# Patient Record
Sex: Female | Born: 1970 | Race: White | Hispanic: No | Marital: Married | Smoking: Never smoker
Health system: Southern US, Community
[De-identification: ages and names within clinical notes are randomized; demographics above are authoritative.]

## PROBLEM LIST (undated history)

## (undated) DIAGNOSIS — R002 Palpitations: Secondary | ICD-10-CM

## (undated) HISTORY — DX: Palpitations: R00.2

---

## 1999-05-02 ENCOUNTER — Other Ambulatory Visit: Admission: RE | Admit: 1999-05-02 | Discharge: 1999-05-02 | Payer: Self-pay | Admitting: Obstetrics and Gynecology

## 1999-09-14 ENCOUNTER — Inpatient Hospital Stay (HOSPITAL_COMMUNITY): Admission: AD | Admit: 1999-09-14 | Discharge: 1999-09-14 | Payer: Self-pay | Admitting: Obstetrics and Gynecology

## 1999-10-17 ENCOUNTER — Inpatient Hospital Stay (HOSPITAL_COMMUNITY): Admission: AD | Admit: 1999-10-17 | Discharge: 1999-10-17 | Payer: Self-pay | Admitting: Obstetrics and Gynecology

## 1999-11-02 ENCOUNTER — Inpatient Hospital Stay (HOSPITAL_COMMUNITY): Admission: AD | Admit: 1999-11-02 | Discharge: 1999-11-04 | Payer: Self-pay | Admitting: Obstetrics & Gynecology

## 1999-12-20 ENCOUNTER — Other Ambulatory Visit: Admission: RE | Admit: 1999-12-20 | Discharge: 1999-12-20 | Payer: Self-pay | Admitting: Obstetrics and Gynecology

## 2000-03-12 ENCOUNTER — Encounter: Admission: RE | Admit: 2000-03-12 | Discharge: 2000-03-12 | Payer: Self-pay | Admitting: Obstetrics and Gynecology

## 2000-03-12 ENCOUNTER — Encounter: Payer: Self-pay | Admitting: Obstetrics and Gynecology

## 2001-01-01 ENCOUNTER — Other Ambulatory Visit: Admission: RE | Admit: 2001-01-01 | Discharge: 2001-01-01 | Payer: Self-pay | Admitting: Obstetrics and Gynecology

## 2001-09-27 ENCOUNTER — Encounter (INDEPENDENT_AMBULATORY_CARE_PROVIDER_SITE_OTHER): Payer: Self-pay

## 2001-09-27 ENCOUNTER — Ambulatory Visit (HOSPITAL_COMMUNITY): Admission: RE | Admit: 2001-09-27 | Discharge: 2001-09-27 | Payer: Self-pay | Admitting: Obstetrics and Gynecology

## 2002-09-01 ENCOUNTER — Inpatient Hospital Stay (HOSPITAL_COMMUNITY): Admission: AD | Admit: 2002-09-01 | Discharge: 2002-09-01 | Payer: Self-pay | Admitting: Obstetrics and Gynecology

## 2002-09-23 ENCOUNTER — Inpatient Hospital Stay (HOSPITAL_COMMUNITY): Admission: AD | Admit: 2002-09-23 | Discharge: 2002-11-02 | Payer: Self-pay | Admitting: Obstetrics and Gynecology

## 2002-09-23 ENCOUNTER — Ambulatory Visit (HOSPITAL_COMMUNITY): Admission: RE | Admit: 2002-09-23 | Discharge: 2002-09-23 | Payer: Self-pay | Admitting: Obstetrics and Gynecology

## 2002-09-23 ENCOUNTER — Encounter: Payer: Self-pay | Admitting: Obstetrics and Gynecology

## 2002-09-24 ENCOUNTER — Encounter: Payer: Self-pay | Admitting: Obstetrics and Gynecology

## 2002-09-25 ENCOUNTER — Encounter: Payer: Self-pay | Admitting: *Deleted

## 2002-09-26 ENCOUNTER — Encounter: Payer: Self-pay | Admitting: *Deleted

## 2002-09-28 ENCOUNTER — Encounter: Payer: Self-pay | Admitting: *Deleted

## 2002-09-29 ENCOUNTER — Encounter: Payer: Self-pay | Admitting: Obstetrics and Gynecology

## 2002-10-02 ENCOUNTER — Encounter: Payer: Self-pay | Admitting: Obstetrics and Gynecology

## 2002-10-05 ENCOUNTER — Encounter: Payer: Self-pay | Admitting: Obstetrics and Gynecology

## 2002-10-07 ENCOUNTER — Encounter: Payer: Self-pay | Admitting: Obstetrics and Gynecology

## 2002-10-09 ENCOUNTER — Encounter: Payer: Self-pay | Admitting: Obstetrics and Gynecology

## 2002-10-12 ENCOUNTER — Encounter: Payer: Self-pay | Admitting: Obstetrics and Gynecology

## 2002-10-14 ENCOUNTER — Encounter: Payer: Self-pay | Admitting: Obstetrics and Gynecology

## 2002-10-16 ENCOUNTER — Encounter: Payer: Self-pay | Admitting: Obstetrics and Gynecology

## 2002-10-19 ENCOUNTER — Encounter: Payer: Self-pay | Admitting: Obstetrics and Gynecology

## 2002-10-21 ENCOUNTER — Encounter: Payer: Self-pay | Admitting: Obstetrics and Gynecology

## 2002-10-23 ENCOUNTER — Encounter: Payer: Self-pay | Admitting: Obstetrics and Gynecology

## 2002-10-26 ENCOUNTER — Encounter: Payer: Self-pay | Admitting: Obstetrics and Gynecology

## 2002-10-27 ENCOUNTER — Encounter: Payer: Self-pay | Admitting: Obstetrics and Gynecology

## 2002-10-28 ENCOUNTER — Encounter (INDEPENDENT_AMBULATORY_CARE_PROVIDER_SITE_OTHER): Payer: Self-pay | Admitting: Specialist

## 2002-11-03 ENCOUNTER — Encounter: Admission: RE | Admit: 2002-11-03 | Discharge: 2002-12-03 | Payer: Self-pay | Admitting: Obstetrics and Gynecology

## 2002-12-09 ENCOUNTER — Other Ambulatory Visit: Admission: RE | Admit: 2002-12-09 | Discharge: 2002-12-09 | Payer: Self-pay | Admitting: Obstetrics and Gynecology

## 2003-01-02 ENCOUNTER — Encounter: Admission: RE | Admit: 2003-01-02 | Discharge: 2003-02-01 | Payer: Self-pay | Admitting: Obstetrics and Gynecology

## 2003-12-30 ENCOUNTER — Other Ambulatory Visit: Admission: RE | Admit: 2003-12-30 | Discharge: 2003-12-30 | Payer: Self-pay | Admitting: Obstetrics and Gynecology

## 2005-02-13 ENCOUNTER — Other Ambulatory Visit: Admission: RE | Admit: 2005-02-13 | Discharge: 2005-02-13 | Payer: Self-pay | Admitting: Obstetrics and Gynecology

## 2010-02-14 ENCOUNTER — Ambulatory Visit: Payer: Self-pay | Admitting: Family Medicine

## 2010-02-14 DIAGNOSIS — M7750 Other enthesopathy of unspecified foot: Secondary | ICD-10-CM | POA: Insufficient documentation

## 2010-02-14 DIAGNOSIS — M79609 Pain in unspecified limb: Secondary | ICD-10-CM | POA: Insufficient documentation

## 2010-02-14 DIAGNOSIS — Q667 Congenital pes cavus, unspecified foot: Secondary | ICD-10-CM | POA: Insufficient documentation

## 2010-10-10 NOTE — Assessment & Plan Note (Signed)
Summary: 4pm Suzanne Roberts - POSSIBLE ST FX FOOT/RUNNER   Vital Signs:  Patient profile:   40 year old female Height:      65 inches Weight:      120 pounds BMI:     20.04 BP sitting:   127 / 82  Vitals Entered By: Lillia Pauls CMA (February 14, 2010 4:02 PM)  History of Present Illness: 40 yo F here with right foot pain  Patient is an active female - runs 22 mpw, spins, and works out with [employer]. States yesterday developed pain in right forefoot in the afternoon. Earlier that day she did plyo, spinning, and worked out with [employer]. Pain did not occur during workout but only several hours later when sitting at work Pain on top of right foot. No swelling or bruising.   Has h/o stress fracture in femur about 15 years ago. Takes some supplemental calcium and vitamin D. Also reports pain in dorsum and plantar left foot about 2 miles into her runs that radiates up her foot. Has some otc inserts that provide good arch support.  Allergies (verified): No Known Drug Allergies  Physical Exam  General:  Well-developed,well-nourished,in no acute distress; alert,appropriate and cooperative throughout examination Msk:  Bilateral feet: No gross deformity, swelling, or bruising. Transverse arch breakdown with significant callus 2nd-4th MT heads. Cavus feet. No TTP bilateral feet currently. FROM toes and ankles. No hallux valgus or rigidus. NV intact distally Forefoot runner with otherwise neutral gait. Additional Exam:  MSK u/s:  No evidence of bony irregularities, edema, increased doppler flow bilateral feet dorsally that would be c/w stress fracture.   Impression & Recommendations:  Problem # 1:  FOOT PAIN, BILATERAL (ICD-729.5) Assessment New 2/2 metatarsalgia. No evidence of stress fracture.  She may also have developed some tendinitis in dorsum right foot - stated earlier had pain with dorsiflexion of foot.  Provided MT pads in bilateral orthotics which felt comfortable to her.  See  instructions for further.  Problem # 2:  METATARSALGIA (ICD-726.70) Assessment: New MT pads.  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  Patient Instructions: 1)  Take 1300mg  of calcium and 800 International Units of vitamin D every day. 2)  You do not have a stress fracture. 3)  Continue activity as tolerated - for the next 2-3 running sessions, start by dropping down to 50% of your normal runs at 60% speed and increase each session. 4)  Wear the metatarsal pads in your shoes. 5)  Follow up with [place] as needed.

## 2011-01-26 NOTE — Discharge Summary (Signed)
NAME:  Suzanne Roberts, Suzanne Roberts NO.:  [id number]   MEDICAL RECORD NO.:  [id number]                   PATIENT TYPE:  INP   LOCATION:  9138                                 FACILITY:  [facility]   PHYSICIAN:  Tracie Harrier, M.D.              DATE OF BIRTH:  Nov 25, 1970   DATE OF ADMISSION:  09/23/2002  DATE OF DISCHARGE:  11/02/2002                                 DISCHARGE SUMMARY   ADMISSION DIAGNOSES:  1. Intrauterine pregnancy at 29-1/7 weeks estimated gestational age.  2. Twin gestation with intrauterine growth retardation of twin B.   DISCHARGE DIAGNOSES:  1. Status post low transverse cesarean section.  2. Viable female infant, viable female infant.   PROCEDURE:  Primary low transverse cesarean section.   REASON FOR ADMISSION:  Please see dictated H&P.   HOSPITAL COURSE:  The patient is a 40 year old white, married female,  gravida 3, para 1 that was admitted to [facility] at 27-1/7  weeks estimated gestational age with a twin gestation.  Twin B was noted to  have intrauterine growth retardation that had been followed by serial  ultrasounds.  The patient was admitted due to concerning Doppler flow  studies.  The patient was admitted for bed rest and IV fluids and  observation of twin B.  Doppler studies continued to reveal elevated FD  ratios but normal MCA scores.  Twin B was gaining weight but remained in  less than the fifth percentile where twin A grew at the 95th percentile.  At  34-1/7 weeks amniocentesis was performed on twin A, the larger twin.  Lecithin/sphingomyelin ratio was 2.2:1 without prostaglandin.  After  consulting with maternal fetal medicine and the neonatal intensive care unit  the decision was made to proceed with a cesarean delivery.  The patient was  taken to the operating room where spinal anesthesia was administered without  difficulty.  A low transverse incision was made with delivery of twin A, a  viable female  infant weighing 5 pounds, 10 ounces with Apgar's of 7 at one  minute, 8 at five minutes.  Arterial cord pH was 7.34.  Twin B was also  delivered, a viable female infant weighing 2 pounds, one ounce with Apgar's  of 7 at one minute, 9 and five minutes.  Arterial cord pH was 7.34.  The  patient tolerated the procedure well and was taken to the recovery room in  stable condition.  On postoperative day one patient complained of some  incisional soreness, babies were stable in the NICU, patient had good return  of bowel function, abdomen was soft, fundus was firm.  Abdominal dressing  was removed revealing an incision that was clean, dry and intact.  Ecchymosis was noted inferior and superior to the incisional site.  Labs  revealed hemoglobin of 10.2, platelet count of 119,000, WBC of 7800.  On  postoperative day two patient complained of some  bright red bleeding on the  left margin of her incision, abdomen was soft and fundus was firm.  A small  amount of oozing was noted at the left side of the incisional site,  questionable staple had been pulled, the incision itself appeared intact  with slight ecchymosis noted superior and inferior to the incisional site.  The patient was ambulating well, tolerating a regular diet without complaint  of nausea and vomiting.  On postoperative day three the patient had  complained of some chills in the middle of the night, she was without fever,  abdomen was soft, some mild induration was noted two cm above the incisional  site, fundus was firm and nontender.  On postoperative day four the patient  was afebrile, fundus was firm and nontender, hematoma was still evident,  otherwise she was doing well and ambulating without assistance.  On  postoperative day five incision continued to have ecchymosis superior and  inferior to the incision which also extended into the inguinal folds  bilaterally.  The patient was ambulating well, she was having good relief  with  p.o. pain medications and was ready for discharge.  Staples did remain  in place.   CONDITION ON DISCHARGE:  Good.   DIET:  Regular as tolerated.   ACTIVITY:  No heavy lifting, no driving x2 weeks, no vaginal entry.   FOLLOW UP:  The patient is to follow up in the office in one week for an  incision check.  She is to call for temperature greater than 100 degrees,  persistent nausea and vomiting, heavy vaginal bleeding, and/or redness or  drainage from the incisional site.   DISCHARGE MEDICATIONS:  Percocet 5/325 #30 one q.4-6h p.r.n., Motrin 600 mg  q.6h p.r.n., prenatal vitamins one p.o. daily, Colace one p.o. daily p.r.n.      Julio Sicks, N.P.                        Tracie Harrier, M.D.    CC/MEDQ  D:  12/03/2002  T:  12/04/2002  Job:  [id number]

## 2011-01-26 NOTE — Op Note (Signed)
[facility]  Patient:    Suzanne Roberts, Suzanne Roberts Visit Number: [id number] MRN: [id number]          Service Type: DSU Location: [facility] Attending Physician:  Marcelle Overlie Dictated by:   Marcelle Overlie, M.D. Proc. Date: 09/27/01 Admit Date:  09/27/2001 Discharge Date: 09/27/2001                             Operative Report  PREOPERATIVE DIAGNOSIS:       Missed abortion.  POSTOPERATIVE DIAGNOSIS:      Missed abortion.  PROCEDURE:                    Dilation and evacuation.  SURGEON:                      Marcelle Overlie, M.D.  ANESTHESIA:                   MAC with paracervical block.  COMPLICATIONS:                None.  PATHOLOGY:                    Uterine curettings.  DESCRIPTION OF PROCEDURE:     The patient was taken to the operating room, where she received IV sedation and was placed in the lithotomy position.  The vagina and vulva were prepped and draped in the usual sterile fashion.  An in-and-out catheter was used to empty the bladder.  A speculum was inserted in the vagina.  The cervix was grasped with a tenaculum and the uterus was sounded to 9 cm.  Paracervical block was performed and the cervical internal os was gently dilated using Pratt dilators.  A #7 suction cannula was inserted into the uterus.  The uterus was then suctioned thoroughly x 3 with contents consistent with products of conception.  A sharp curet was inserted into the uterus.  The uterus was thoroughly curetted on all four walls.  There was very little tissue.  A final suction curettage was performed, with scant tissue retrieved.  There was no bleeding at the end of the procedure.  All sponge, lap and instrument counts were correct x 2.  The patient went to the recovery room in good condition. Dictated by:   Marcelle Overlie, M.D. Attending Physician:  Marcelle Overlie DD:  09/27/01 TD:  09/29/01 Job: [id number]

## 2011-01-26 NOTE — Op Note (Signed)
NAME:  Suzanne Roberts, Suzanne Roberts                        ACCOUNT NO.:  [id number]   MEDICAL RECORD NO.:  [id number]                   PATIENT TYPE:  INP   LOCATION:  9156                                 FACILITY:  [facility]   PHYSICIAN:  Dineen Kid. Rana Snare, M.D.                 DATE OF BIRTH:  October 21, 1970   DATE OF PROCEDURE:  10/28/2002  DATE OF DISCHARGE:                                 OPERATIVE REPORT   PREOPERATIVE DIAGNOSIS:  Intrauterine pregnancy at 10 and 2/7 weeks with  twin gestations with IUGR of twin B, vertex breech presentation and mature  amniocentesis of twin A.   POSTOPERATIVE DIAGNOSIS:  Intrauterine pregnancy at 57 and 2/7 weeks with  twin gestations with IUGR of twin B, vertex breech presentation and mature  amniocentesis of twin A.   OPERATION PERFORMED:  Primary low transverse cesarean section.   SURGEON:  Dineen Kid. Rana Snare, M.D.   ANESTHESIA:  Spinal.   INDICATIONS FOR PROCEDURE:  The patient is a 40 year old G3, P1, A1 who was  admitted originally to the hospital at 29-1/7 weeks with a known twin  gestation and IUGR of twin B followed by serial ultrasound.  She had  originally planned amniocentesis for genetic evaluation.  She was admitted  because of no end diastolic flow on Doppler cord flow studies on twin B,  again at 29-1/7 weeks.  She remained on bed rest, IV fluids and  hospitalization until today due to abnormal Doppler flow studies of twin B,  showed minimal growth of twin B.  After the initial study there remained  some end diastolic flow but elevated S:D ratios on twin B, the weight  remaining less than fifth percentile where as twin A grew at 95th  percentile.  She had normal fetal surveillance throughout her hospital  course.  Finally at 34-1/7 weeks underwent amniocentesis of the larger twin.  The procedure was uncomplicated and the L:S ratio was 2.2:1 with no PG.  Due  to mature amniocentesis for the larger twin, we elected to proceed with  primary cesarean  section and delivery of the twins after extensive maternal  fetal medicine consultation and also NICU consultation.  The patient and her  husband understood the risks of prematurity and at this time elected to  proceed with delivery so as to optimize care for twin B.  The risks and  benefits again were discussed at length and informed consent was obtained.   OPERATIVE FINDINGS:  Viable female infant which was twin A.  Apgars were 7 and  8, pH arterial was 7.34, the weight was 5 pounds, 10 ounces.  Twin B was a  viable female, Apgars were 7 and 9 with a weight of 2 pounds, 10 ounces and  a pH arterial of 7.34.   DESCRIPTION OF PROCEDURE:  After adequate anesthesia, the patient was placed  in the supine position with left lateral tilt.  She  was sterilely prepped  and draped with Betadine solution and a Foley catheter was sterilely placed.  A Pfannenstiel skin incision was made two fingerbreadths above the pubic  symphysis, was taken sharply through fascia, incised transversely and  extended superior and inferior off the belly of the rectus muscle, separated  sharply in the midline and the peritoneum was entered sharply.  The bladder  flap was created and placed behind the bladder blade.  A low submyotomy  incision was made down to the amniotic sac of B.  Amniotomy was performed.  Clear fluid was retrieved and the infant's vertex was delivered and the  nuchal cord times one was reduced, the infant was then delivered, cord  clamped, cut and the infant was handed to the pediatricians for  resuscitation.  Cord blood was then obtained and umbilical clamp was then  placed on cord A.  Amniotomy was performed on the sac of twin B.  She was  delivered in a breech position.  Arms were easily reduced and the head was  atraumatically delivered.  The nares and pharynx were suctioned.  Cord was  clamped, cut and she was handed to the pediatricians for resuscitation.  Cord blood was then obtained, the  placentas were extracted manually, noted  to be fused with _________ insertion of twin B's cord and hyperspiraling of  the vessels, sent to pathology for further evaluation.  The uterus was  exteriorized, wiped clean with a dry lap.  The myotomy incision was closed  in two layers, the first layer a running locking layer, the second in an  imbricating layer with good approximation and good hemostasis achieved.  Normal uterus, tubes and ovaries were noted.  The uterus was placed back  into the peritoneal cavity, after a copious amount of irrigation, adequate  hemostasis was assured.  The peritoneum was then closed with 2-0 Vicryl in a  running fashion.  The rectus muscle plicated in the midline.  After  irrigation was applied and adequate hemostasis was assured, the fascia was  then closed with a #1 Vicryl in a running fashion.  Irrigation was applied  and after adequate hemostasis, the skin was stapled and Steri-Strips  applied.  The patient received 1 gm of Cefoxitin after delivering the  placenta.  Sponge and instrument counts were normal times three.  The  estimated blood loss was 800 cc.  The babies were in stable condition and  did get to the NICU for further observation.                                                Dineen Kid Rana Snare, M.D.    DCL/MEDQ  D:  10/28/2002  T:  10/28/2002  Job:  [id number]

## 2011-01-26 NOTE — H&P (Signed)
NAME:  Suzanne Roberts, Suzanne Roberts NO.:  [id number]   MEDICAL RECORD NO.:  [id number]                   PATIENT TYPE:  OUT   LOCATION:  ULT                                  [facility]ILITY:  [facility]   PHYSICIAN:  Dineen Kid. Rana Snare, M.D.                 DATE OF BIRTH:  06/22/1971   DATE OF ADMISSION:  09/23/2002  DATE OF DISCHARGE:                                HISTORY & PHYSICAL   HISTORY OF PRESENT ILLNESS:  The patient is a 40 year old G3, P1, A1 at 45  and 1/7 weeks estimated gestational age based on last menstrual period and  also on early ultrasound with twin pregnancies with known IUGR of twin B.  She has been followed by serial ultrasounds.  Also [facility] at [facility]  recently for ultrasound by Dr. Verne Grain, M.D.  Twin B is female.  She  has consistently lagged one and a half to two weeks behind the dates  symmetrically and Dr. Delton See felt this was due to more constitutional  reasons and a small placenta.  Twin A is a boy and he has consistently  measured consistent with dates or larger.  She presented today for routine  ultrasound at [facility] for weight and Doppler flow.  The weights  were appropriate with twin A measuring 1611 g which is 90th percentile for  29 weeks.  He has a grade 2 placenta, is in the cephalic position.  Twin B  which is the IUGR baby is in the breech position, is the female.  She is  measuring 901 g which is 10th percentile for 29 weeks with a grade 2  placenta and normal amniotic fluid on both babies.  The Doppler cord flow,  however, on twin A is normal.  Twin B shows a normal MCA of 1.97, but no end-  diastolic flow on the umbilical artery.  Because of no end-diastolic flow,  the patient will be admitted for aggressive monitoring, betamethasone,  rescan for diastolic flow and if this does not improve, consider delivery.  Her pregnancy has been complicated by preterm contractions.  She has not had  cervical dilation.  Her estimated  date of confinement is December 10, 2002.  She  had a normal Glucola.  Group B Strep was negative as well as fetal  fibronectin on September 01, 2002.   PHYSICAL EXAMINATION:  VITAL SIGNS:  Blood pressure 100/60.  HEART:  Regular rate and rhythm.  LUNGS:  Clear to auscultation bilaterally.  ABDOMEN:  Gravid, nontender.  PELVIC:  Cervix is closed, thick, and high.   IMPRESSION AND PLAN:  Intrauterine pregnancy at 35 and 1/7 weeks with  vertex/breech fetuses.  Twin B with symmetric intrauterine growth  restriction.  Now the growth is approximately 10the percentile which is an  improvement for this baby.  However, Doppler flow studies are abnormal with  no end-diastolic flow on twin B.  At this time we plan to aggressively  hydrate, place the patient on bed rest, and monitor both babies thoroughly.  We will repeat the Doppler flow in 24-48 hours and give her betamethasone in  the meantime.  If the babies heart rates and monitoring is nonreassuring we  plan to deliver by cesarean section.  Otherwise, if there is no improvement  in end-diastolic flow, we will plan to deliver the twins.                                               Dineen Kid Rana Snare, M.D.    DCL/MEDQ  D:  09/23/2002  T:  09/23/2002  Job:  [id number]

## 2011-06-27 ENCOUNTER — Other Ambulatory Visit: Payer: Self-pay | Admitting: Obstetrics and Gynecology

## 2011-06-27 DIAGNOSIS — R928 Other abnormal and inconclusive findings on diagnostic imaging of breast: Secondary | ICD-10-CM

## 2011-07-24 ENCOUNTER — Ambulatory Visit
Admission: RE | Admit: 2011-07-24 | Discharge: 2011-07-24 | Disposition: A | Discharge status: Home / Self Care | Payer: BC Managed Care – PPO | Source: Ambulatory Visit | Attending: Obstetrics and Gynecology | Admitting: Obstetrics and Gynecology

## 2011-07-24 DIAGNOSIS — R928 Other abnormal and inconclusive findings on diagnostic imaging of breast: Secondary | ICD-10-CM

## 2012-02-22 ENCOUNTER — Other Ambulatory Visit: Payer: Self-pay | Admitting: Dermatology

## 2012-06-25 ENCOUNTER — Other Ambulatory Visit: Payer: Self-pay | Admitting: Obstetrics and Gynecology

## 2012-06-25 DIAGNOSIS — R928 Other abnormal and inconclusive findings on diagnostic imaging of breast: Secondary | ICD-10-CM

## 2012-06-27 ENCOUNTER — Ambulatory Visit
Admission: RE | Admit: 2012-06-27 | Discharge: 2012-06-27 | Disposition: A | Discharge status: Home / Self Care | Payer: BC Managed Care – PPO | Source: Ambulatory Visit | Attending: Obstetrics and Gynecology | Admitting: Obstetrics and Gynecology

## 2012-06-27 DIAGNOSIS — R928 Other abnormal and inconclusive findings on diagnostic imaging of breast: Secondary | ICD-10-CM

## 2012-07-15 DIAGNOSIS — M859 Disorder of bone density and structure, unspecified: Secondary | ICD-10-CM | POA: Insufficient documentation

## 2012-07-15 DIAGNOSIS — M8580 Other specified disorders of bone density and structure, unspecified site: Secondary | ICD-10-CM | POA: Insufficient documentation

## 2012-08-01 ENCOUNTER — Other Ambulatory Visit: Payer: Self-pay | Admitting: Family Medicine

## 2012-08-01 DIAGNOSIS — M545 Low back pain: Secondary | ICD-10-CM

## 2012-08-06 ENCOUNTER — Ambulatory Visit
Admission: RE | Admit: 2012-08-06 | Discharge: 2012-08-06 | Disposition: A | Discharge status: Home / Self Care | Payer: BC Managed Care – PPO | Source: Ambulatory Visit | Attending: Family Medicine | Admitting: Family Medicine

## 2012-08-06 DIAGNOSIS — M545 Low back pain: Secondary | ICD-10-CM

## 2013-07-02 ENCOUNTER — Other Ambulatory Visit: Payer: Self-pay | Admitting: Obstetrics and Gynecology

## 2013-07-02 DIAGNOSIS — R928 Other abnormal and inconclusive findings on diagnostic imaging of breast: Secondary | ICD-10-CM

## 2013-07-17 ENCOUNTER — Ambulatory Visit
Admission: RE | Admit: 2013-07-17 | Discharge: 2013-07-17 | Disposition: A | Discharge status: Home / Self Care | Payer: BC Managed Care – PPO | Source: Ambulatory Visit | Attending: Obstetrics and Gynecology | Admitting: Obstetrics and Gynecology

## 2013-07-17 DIAGNOSIS — R928 Other abnormal and inconclusive findings on diagnostic imaging of breast: Secondary | ICD-10-CM

## 2013-12-01 ENCOUNTER — Ambulatory Visit (INDEPENDENT_AMBULATORY_CARE_PROVIDER_SITE_OTHER): Payer: BC Managed Care – PPO | Admitting: Sports Medicine

## 2013-12-01 ENCOUNTER — Encounter: Payer: Self-pay | Admitting: Sports Medicine

## 2013-12-01 VITALS — BP 111/71 | HR 62 | Ht 65.0 in | Wt 120.0 lb

## 2013-12-01 DIAGNOSIS — M7750 Other enthesopathy of unspecified foot: Secondary | ICD-10-CM

## 2013-12-01 DIAGNOSIS — M79609 Pain in unspecified limb: Secondary | ICD-10-CM

## 2013-12-01 NOTE — Assessment & Plan Note (Signed)
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  We will reposition MT pads and today they felt good.  If we get those in right location - keep using them.

## 2013-12-01 NOTE — Progress Notes (Signed)
Patient ID: Suzanne ClossSuzanne C Bady, female   DOB: 07/11/1971, 43 y.o.   MRN: [id number]    Subjective: HPI: Patient is a 43 y.o. female presenting to clinic today for new patient appointment for bilateral foot pain.  Patient is a runner presenting with bilateral forefoot pain R>L. She states this has been a progressively worsening problem for her. She was evaluated last year by Dr. 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 This helped some, but did not solve the problem. She bought shoe inserts from [employer] for arch support and used metatarsal pads which she feels made her left foot worse. She returned to the orthopedic office and was given a second cortisone injection in the right with little relief.   Patient runs 24 miles per week. She is currently training for a half marathon. Her pain does not limit her running because she pushes through the discomfort. She states she does not have any numbness or tingling in her toes, however she has a sense of "dullness" in the 3rd and 4th toes on her right foot.  History Reviewed: Non-smoker.  ROS: Please see HPI above.  Objective: Office vital signs reviewed. BP 111/71  Pulse 62  Ht 5\' 5"  (1.651 m)  Wt 120 lb (54.432 kg)  BMI 19.97 kg/m2  Physical Examination:  General: Awake, alert. Thin white female.  Lower extremities: Equal leg length Normal Q angle Gait neutral, forefoot runner  Ankle:  Bilateral ankles stable with full range of motion  Left foot:  No gross deformities High longitudinal arch with sitting, but arches flatten with standing. Slight pronation of left foot. No TTP of calcaneus.  Achilles tendon wnl Negative metatarsal squeeze. Strength and sensation grossly intact. 2+ pulses Loss of transverse arch most notable at 4th metatarsal  Right Foot: High longitudinal arch, also flattens with standing but not as great as left. No TTP of calcaneus, normal achilles tendon. Negative  metatarsal squeeze. Slight TTP of 4th metatarsal Strength grossly intact. Decreased sensation of 3rd and 4th toes compared to others. 2+ pulses Loss of transverse arch (more than left) most obvious at 3rd and 4th metatarsal.  Calluses along 2-4th metatarsal heads   X-rays reviewed from prior evaluation: No fracture or dislocation. Accessory sesamoid bones noted on 1st and 2nd metatarsals.  Assessment: 43 y.o. female with bilateral foot pain  Plan: See Problem List and After Visit Summary

## 2013-12-01 NOTE — Assessment & Plan Note (Signed)
Bilateral foot pain with R>L. Most likely due to transverse arch breakdown. Currently wearing firm sole inserts without metatarsal pads. - Sports insoles with appropriately placed metatarsal pads - Continue running - Consider custom orthotics if pain not improved with sports inserts

## 2013-12-01 NOTE — Patient Instructions (Signed)
Try the sports insoles with the metatarsal pads. If this works, you can order new ones online. If it is not helping, you can consider custom orthotics.  We will see you back as needed.

## 2014-05-14 ENCOUNTER — Encounter: Payer: Self-pay | Admitting: *Deleted

## 2014-06-25 ENCOUNTER — Other Ambulatory Visit: Payer: Self-pay

## 2014-06-28 ENCOUNTER — Other Ambulatory Visit: Payer: Self-pay | Admitting: Obstetrics and Gynecology

## 2014-06-29 LAB — CYTOLOGY - PAP

## 2014-12-14 ENCOUNTER — Other Ambulatory Visit: Payer: Self-pay | Admitting: Dermatology

## 2015-07-13 ENCOUNTER — Other Ambulatory Visit: Payer: Self-pay | Admitting: Obstetrics and Gynecology

## 2015-07-13 DIAGNOSIS — R928 Other abnormal and inconclusive findings on diagnostic imaging of breast: Secondary | ICD-10-CM

## 2015-07-19 ENCOUNTER — Ambulatory Visit
Admission: RE | Admit: 2015-07-19 | Discharge: 2015-07-19 | Disposition: A | Discharge status: Home / Self Care | Payer: Managed Care, Other (non HMO) | Source: Ambulatory Visit | Attending: Obstetrics and Gynecology | Admitting: Obstetrics and Gynecology

## 2015-07-19 DIAGNOSIS — R928 Other abnormal and inconclusive findings on diagnostic imaging of breast: Secondary | ICD-10-CM

## 2017-09-04 ENCOUNTER — Other Ambulatory Visit: Payer: Self-pay | Admitting: Obstetrics and Gynecology

## 2017-09-09 ENCOUNTER — Other Ambulatory Visit: Payer: Self-pay | Admitting: Obstetrics and Gynecology

## 2017-09-09 DIAGNOSIS — N6310 Unspecified lump in the right breast, unspecified quadrant: Secondary | ICD-10-CM

## 2017-09-12 DIAGNOSIS — Z83719 Family history of colon polyps, unspecified: Secondary | ICD-10-CM | POA: Insufficient documentation

## 2017-09-12 DIAGNOSIS — Z8371 Family history of colonic polyps: Secondary | ICD-10-CM | POA: Insufficient documentation

## 2017-09-16 ENCOUNTER — Ambulatory Visit
Admission: RE | Admit: 2017-09-16 | Discharge: 2017-09-16 | Disposition: A | Discharge status: Home / Self Care | Payer: Managed Care, Other (non HMO) | Source: Ambulatory Visit | Attending: Obstetrics and Gynecology | Admitting: Obstetrics and Gynecology

## 2017-09-16 DIAGNOSIS — N6310 Unspecified lump in the right breast, unspecified quadrant: Secondary | ICD-10-CM

## 2017-10-05 ENCOUNTER — Other Ambulatory Visit: Payer: Self-pay | Admitting: Obstetrics and Gynecology

## 2017-10-05 DIAGNOSIS — Z803 Family history of malignant neoplasm of breast: Secondary | ICD-10-CM

## 2018-03-24 ENCOUNTER — Ambulatory Visit
Admission: RE | Admit: 2018-03-24 | Discharge: 2018-03-24 | Disposition: A | Discharge status: Home / Self Care | Payer: Managed Care, Other (non HMO) | Source: Ambulatory Visit | Attending: Obstetrics and Gynecology | Admitting: Obstetrics and Gynecology

## 2018-03-24 DIAGNOSIS — Z803 Family history of malignant neoplasm of breast: Secondary | ICD-10-CM

## 2018-03-24 MED ORDER — GADOBENATE DIMEGLUMINE 529 MG/ML IV SOLN
10.0000 mL | Freq: Once | INTRAVENOUS | Status: AC | PRN
  Administered 2018-03-24: 10 mL via INTRAVENOUS

## 2018-08-12 DIAGNOSIS — Z86010 Personal history of colonic polyps: Secondary | ICD-10-CM | POA: Insufficient documentation

## 2018-08-29 IMAGING — MR MR BILATERAL BREAST WITHOUT AND WITH CONTRAST
8 of 12 series · 33 of 48 positions shown · IV contrast (multihance)
Comparison: Previous mammogram and ultrasound examinations, the
most recent dated 09/16/2017.

CLINICAL DATA: Family history of breast cancer in the patient's
mother at age 62 and again at age 70.

LABS:  None obtained on site today.
EXAM:
BILATERAL BREAST MRI WITH AND WITHOUT CONTRAST
TECHNIQUE: Multiplanar, multisequence MR images of both breasts were obtained
prior to and following the intravenous administration of 10 ml of
MultiHance.

[Series 2: t2_tirm_tra ipat (a-p) · axial · 3.0mm · 0.64mm/px · 1 of 58 slices shown]
[im 1/58]
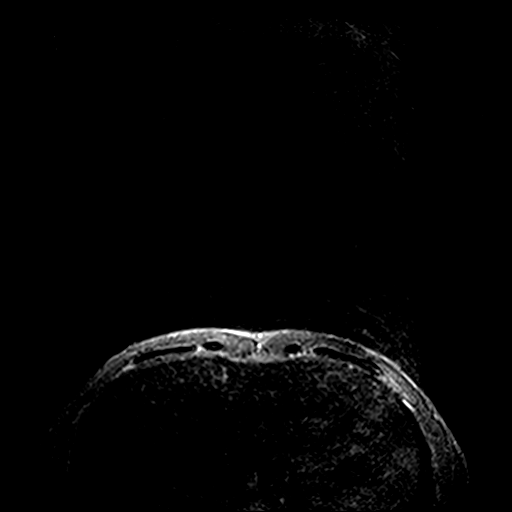

[Series 3: fl3d pre-cm no · axial · non-contrast · 1.2mm · 0.86mm/px · z∈[-96,+95]mm · 5 of 160 slices shown]
[im 1/160]
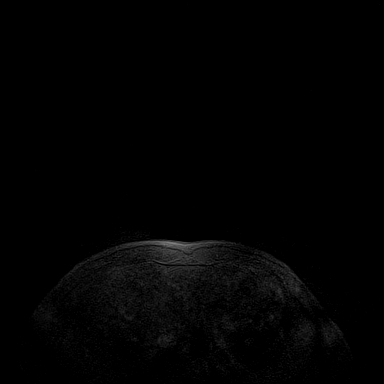
[im 40/160]
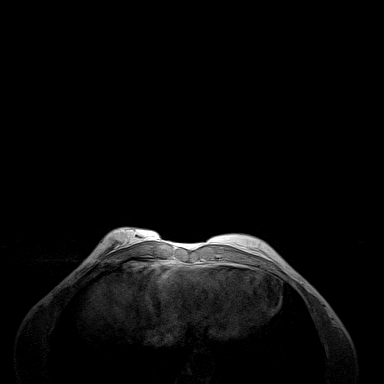
[im 80/160]
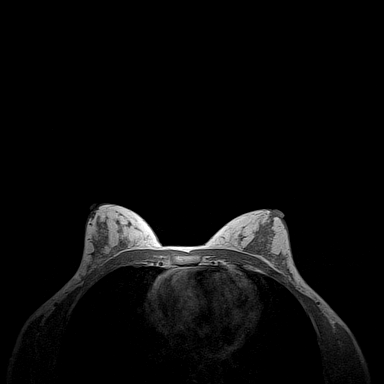
[im 120/160]
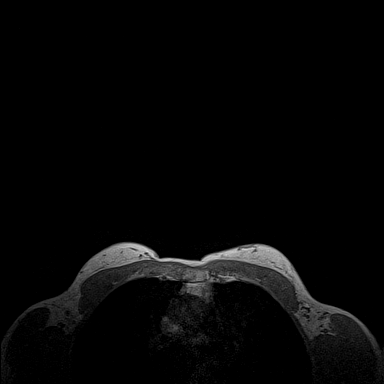
[im 160/160]
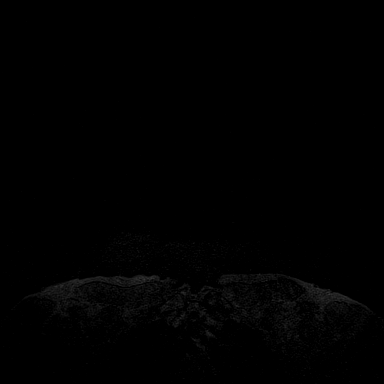

[Series 4: fl3d pre-cm · axial · non-contrast · 1.2mm · 0.86mm/px · z∈[-96,+95]mm · 5 of 160 slices shown]
[im 1/160]
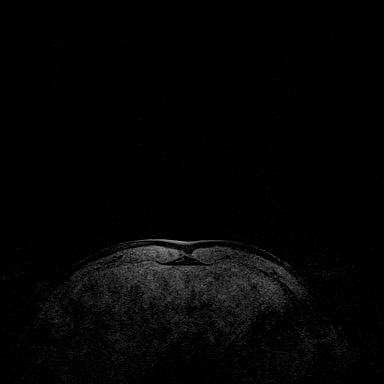
[im 40/160]
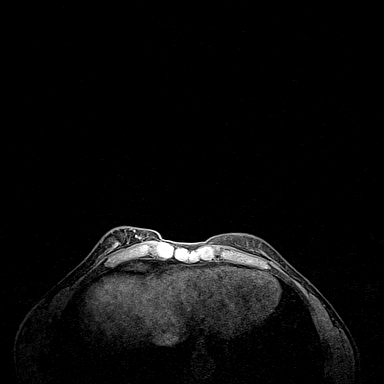
[im 80/160]
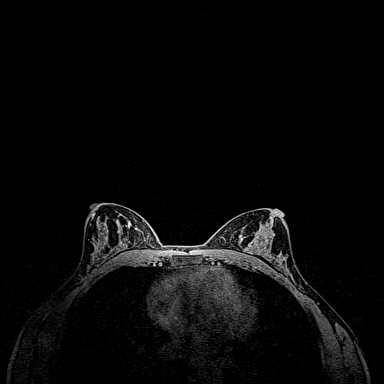
[im 120/160]
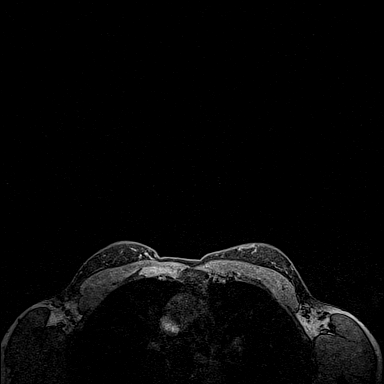
[im 160/160]
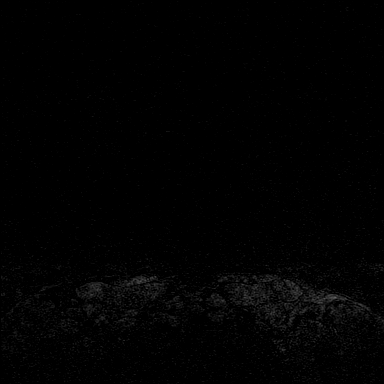

[Series 5: fl3d post-cm 20 · axial · 1.2mm · 0.86mm/px · z∈[-96,+95]mm · 5 of 160 slices shown (1 of 3)]
[im 1/160]
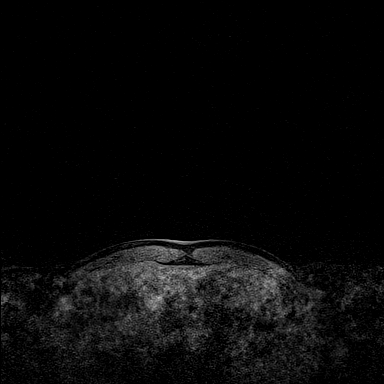
[im 40/160]
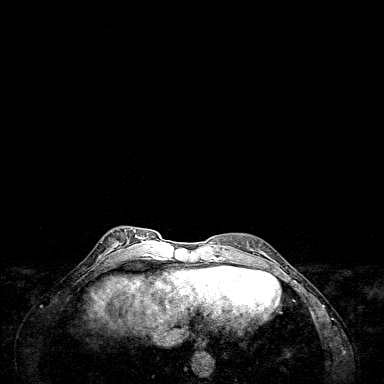
[im 80/160]
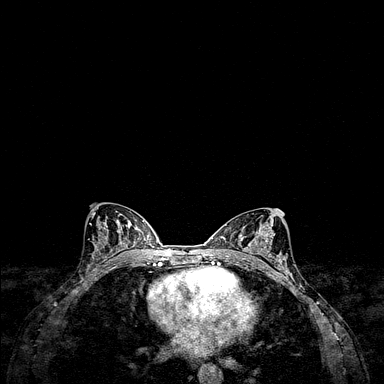
[im 120/160]
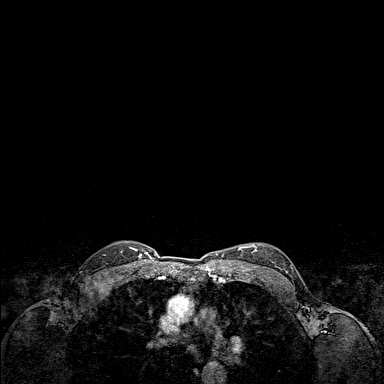
[im 160/160]
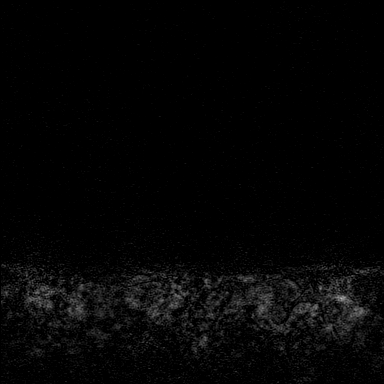

[Series 6: fl3d post-cm 20 · axial · 1.2mm · 0.86mm/px · z∈[-96,+95]mm · 5 of 160 slices shown (2 of 3)]
[im 1/160]
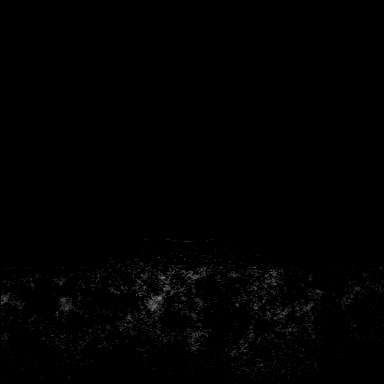
[im 40/160]
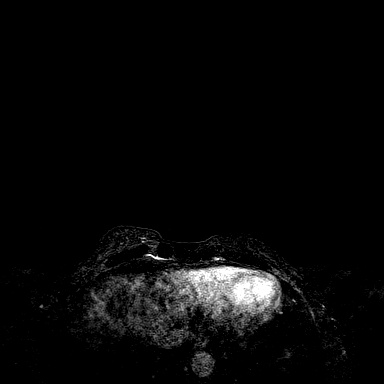
[im 80/160]
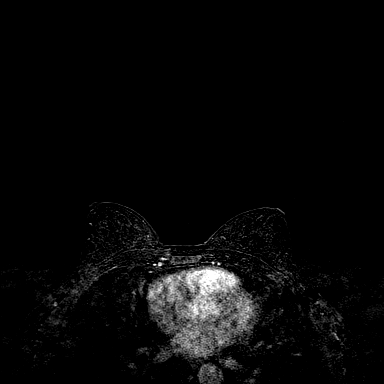
[im 120/160]
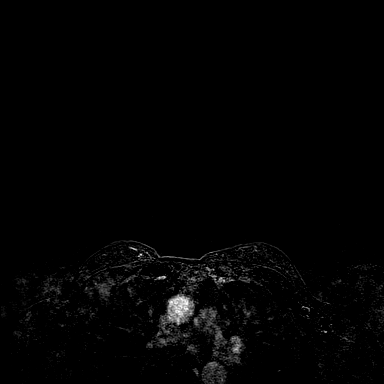
[im 160/160]
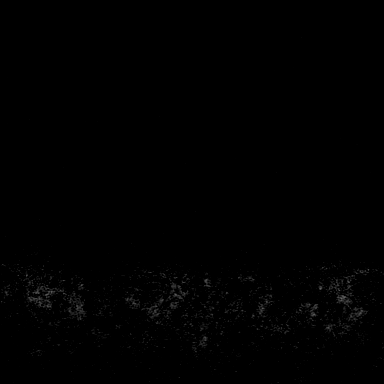

[Series 7: fl3d post-cm 20 · axial · 192.0mm · 0.86mm/px · 1 of 1 slices shown (3 of 3)]
[im 1/1]
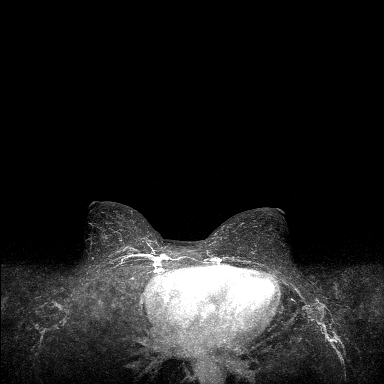

[Series 8: fl3d post-cm 3min · axial · 1.2mm · 0.86mm/px · z∈[-96,+95]mm · 6 of 160 slices shown]
[im 1/160]
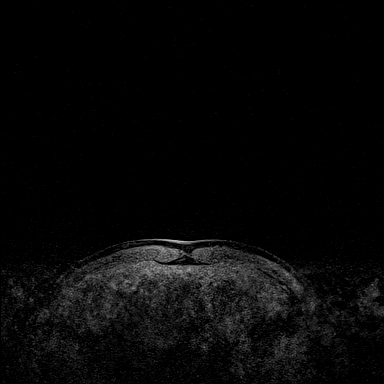
[im 32/160]
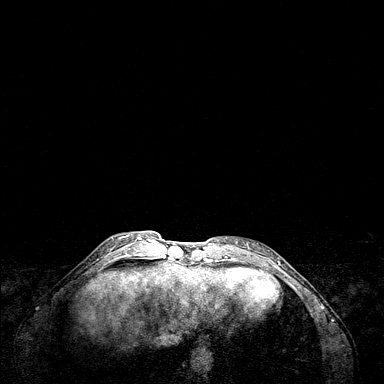
[im 64/160]
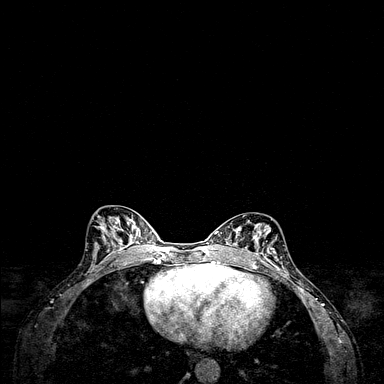
[im 96/160]
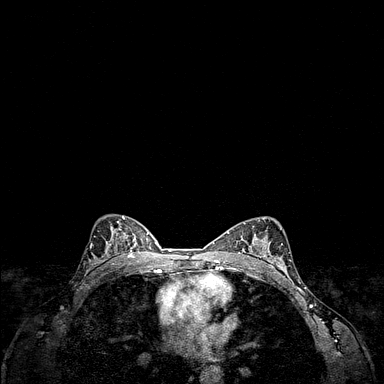
[im 128/160]
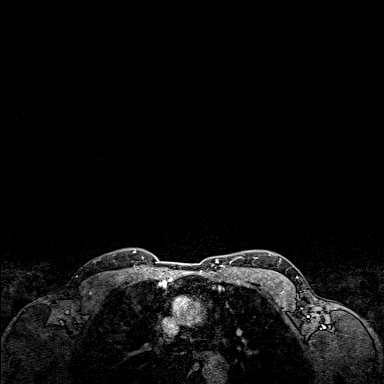
[im 160/160]
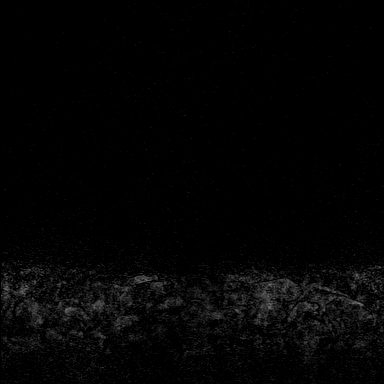

[Series 9: fl3d post-cm 3min_sub · axial · 1.2mm · 0.86mm/px · z∈[-96,+57]mm · 5 of 160 slices shown]
[im 1/160]
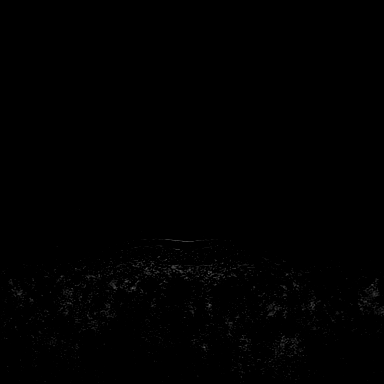
[im 32/160]
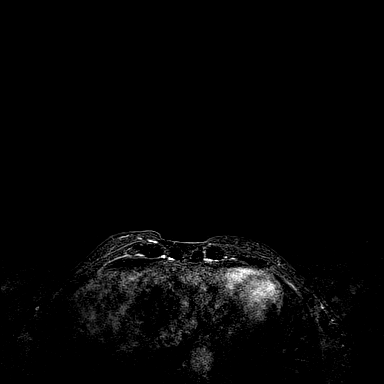
[im 64/160]
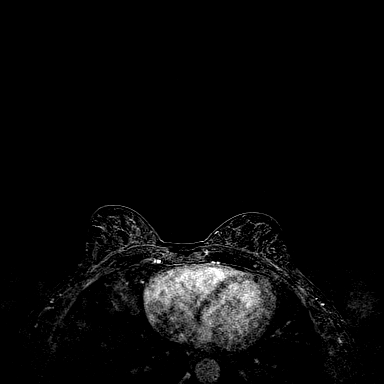
[im 96/160]
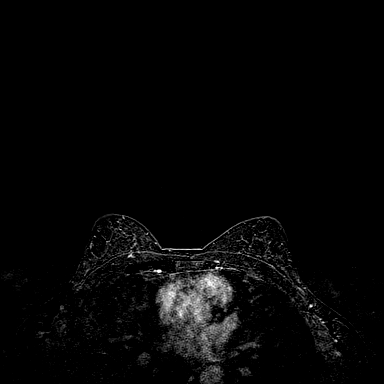
[im 128/160]
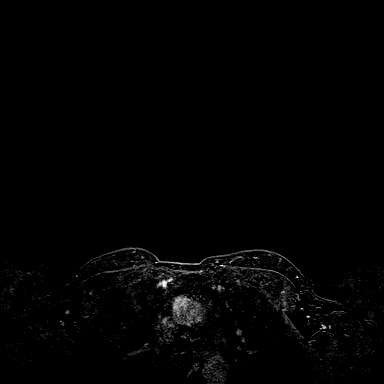

[33 of 48 positions shown; findings below may reference images not displayed]

THREE-DIMENSIONAL MR IMAGE RENDERING ON INDEPENDENT WORKSTATION:

Three-dimensional MR images were rendered by post-processing of the
original MR data on an independent workstation. The
three-dimensional MR images were interpreted, and findings are
reported in the following complete MRI report for this study. Three
dimensional images were evaluated at the independent [employer]
workstation
FINDINGS: Breast composition: c. Heterogeneous fibroglandular tissue.

Background parenchymal enhancement: Mild.

Right breast: Multiple cysts. No mass or abnormal enhancement
suspicious for malignancy.

Left breast: Multiple cysts. No mass or abnormal enhancement
suspicious for malignancy.

Lymph nodes: No abnormal appearing lymph nodes.

Ancillary findings:  None.
IMPRESSION: 1. No evidence of malignancy.
2. Multiple bilateral breast cysts.

RECOMMENDATION:
Bilateral screening mammogram in 6 months when due.

BI-RADS CATEGORY  2: Benign.

## 2019-01-02 DIAGNOSIS — M818 Other osteoporosis without current pathological fracture: Secondary | ICD-10-CM | POA: Insufficient documentation

## 2019-06-08 ENCOUNTER — Other Ambulatory Visit: Payer: Self-pay

## 2019-06-08 DIAGNOSIS — Z20822 Contact with and (suspected) exposure to covid-19: Secondary | ICD-10-CM

## 2019-06-09 LAB — SPECIMEN STATUS REPORT

## 2019-06-09 LAB — NOVEL CORONAVIRUS, NAA: SARS-CoV-2, NAA: NOT DETECTED

## 2019-07-14 ENCOUNTER — Other Ambulatory Visit: Payer: Self-pay

## 2019-07-14 DIAGNOSIS — Z20822 Contact with and (suspected) exposure to covid-19: Secondary | ICD-10-CM

## 2019-07-15 LAB — NOVEL CORONAVIRUS, NAA: SARS-CoV-2, NAA: NOT DETECTED

## 2019-08-18 ENCOUNTER — Other Ambulatory Visit: Payer: Self-pay

## 2019-08-18 DIAGNOSIS — Z20822 Contact with and (suspected) exposure to covid-19: Secondary | ICD-10-CM

## 2019-08-20 LAB — NOVEL CORONAVIRUS, NAA: SARS-CoV-2, NAA: NOT DETECTED

## 2019-10-05 ENCOUNTER — Ambulatory Visit: Payer: Managed Care, Other (non HMO) | Attending: Internal Medicine

## 2019-10-05 ENCOUNTER — Other Ambulatory Visit: Payer: Managed Care, Other (non HMO)

## 2019-10-05 DIAGNOSIS — Z20822 Contact with and (suspected) exposure to covid-19: Secondary | ICD-10-CM

## 2019-10-06 LAB — NOVEL CORONAVIRUS, NAA: SARS-CoV-2, NAA: NOT DETECTED

## 2021-01-26 ENCOUNTER — Ambulatory Visit (INDEPENDENT_AMBULATORY_CARE_PROVIDER_SITE_OTHER): Payer: Managed Care, Other (non HMO) | Admitting: Family Medicine

## 2021-01-26 ENCOUNTER — Encounter: Payer: Self-pay | Admitting: Family Medicine

## 2021-01-26 ENCOUNTER — Other Ambulatory Visit: Payer: Self-pay

## 2021-01-26 DIAGNOSIS — M25552 Pain in left hip: Secondary | ICD-10-CM

## 2021-01-26 NOTE — Progress Notes (Signed)
Office Visit Note   Patient: Suzanne Roberts           Date of Birth: August 10, 1971           MRN: [id number] Visit Date: 01/26/2021 Requested by: Gaspar Garbe, MD [address] [place],  [place] [address] PCP: Wylene Simmer Adelfa Koh, MD  Subjective: Chief Complaint  Patient presents with  . Other    Chronic pain in the left buttock and upper hamstring area. This flareup started in January of this year. She noticed this pain with shifting back to running from indoor cycling [place] the [place]. She had switched to indoor cycling and weights during the snowy weather.    HPI: She is here with left posterior hip pain.  I saw her for this in 2014.  At that time she had an L5-S1 disc protrusion and left ankle dorsiflexion weakness.  She went to [facility] physical therapy and had McKenzie protocol, improved significantly and then returned to seeing Lorenda Peck for maintenance dry needling.  She has done very well over the years but about a year ago she began riding her [place] bike more often due to weather, and it seemed to make her pain worse.  Now she is alternating bike with running but she cannot seem to get back to a manageable level.  She has done more dry needling lately with temporary improvement.  Pain is worse when sitting, better when standing and walking.  Sometimes it makes it hard to sleep when lying on her side.               ROS:   All other systems were reviewed and are negative.  Objective: Vital Signs: There were no vitals taken for this visit.  Physical Exam:  General:  Alert and oriented, in no acute distress. Pulm:  Breathing unlabored. Psy:  Normal mood, congruent affect.  Left hip: I do not detect a significant leg length discrepancy today.  No scoliosis.  No significant tenderness over the SI joint.  She is very tender in the left sciatic notch and to palpation all along the sciatic nerve down to the ischial tuberosity.  No pain over the greater trochanter.  Good  range of motion of her hips with no sniffing pain on internal or external rotation.  Patrick's test is negative.  Lower extremity strength and reflexes are normal except for ankle dorsiflexion which he has slightly weak on the left, 4+/5.  Imaging: No results found.  Assessment & Plan: 1.  Left posterior hip pain, suspect due to L5-S1 disc protrusion -We will resume McKenzie protocol per [facility] PT along with dry needling per [place].  If she fails to improve, then we will order a new lumbar MRI scan. -She will stop riding her bike for a while but she can continue running.     Procedures: No procedures performed        PMFS History: Patient Active Problem List   Diagnosis Date Noted  . Adult idiopathic generalized osteoporosis 01/02/2019  . History of adenomatous polyp of colon 08/12/2018  . Family history of colonic polyps 09/12/2017  . Low bone density for age 76/01/2012  . METATARSALGIA 02/14/2010  . FOOT PAIN, BILATERAL 02/14/2010  . TALIPES CAVUS, CONGENITAL 02/14/2010   Past Medical History:  Diagnosis Date  . Palpitations     Family History  Problem Relation Age of Onset  . Breast cancer Mother 18       105 and 41  . Atrial fibrillation  Father     History reviewed. No pertinent surgical history. Social History   Occupational History  . Not on file  Tobacco Use  . Smoking status: Never Smoker  . Smokeless tobacco: Never Used  Substance and Sexual Activity  . Alcohol use: No  . Drug use: No  . Sexual activity: Not on file

## 2022-04-13 ENCOUNTER — Other Ambulatory Visit: Payer: Self-pay | Admitting: Obstetrics and Gynecology

## 2022-04-13 DIAGNOSIS — Z9189 Other specified personal risk factors, not elsewhere classified: Secondary | ICD-10-CM

## 2022-05-04 ENCOUNTER — Other Ambulatory Visit: Payer: Managed Care, Other (non HMO)

## 2022-05-11 ENCOUNTER — Ambulatory Visit
Admission: RE | Admit: 2022-05-11 | Discharge: 2022-05-11 | Disposition: A | Discharge status: Home / Self Care | Payer: Managed Care, Other (non HMO) | Source: Ambulatory Visit | Attending: Obstetrics and Gynecology | Admitting: Obstetrics and Gynecology

## 2022-05-11 DIAGNOSIS — Z9189 Other specified personal risk factors, not elsewhere classified: Secondary | ICD-10-CM

## 2022-05-11 MED ORDER — GADOBUTROL 1 MMOL/ML IV SOLN
6.0000 mL | Freq: Once | INTRAVENOUS | Status: AC | PRN
  Administered 2022-05-11: 6 mL via INTRAVENOUS
